# Patient Record
Sex: Female | Born: 1965 | Race: White | Hispanic: No | Marital: Married | Smoking: Former smoker
Health system: Southern US, Community
[De-identification: ages and names within clinical notes are randomized; demographics above are authoritative.]

## PROBLEM LIST (undated history)

## (undated) DIAGNOSIS — E7800 Pure hypercholesterolemia, unspecified: Secondary | ICD-10-CM

## (undated) DIAGNOSIS — C4491 Basal cell carcinoma of skin, unspecified: Secondary | ICD-10-CM

---

## 2005-06-10 ENCOUNTER — Ambulatory Visit: Payer: Self-pay

## 2005-07-15 ENCOUNTER — Ambulatory Visit: Payer: Self-pay | Admitting: Family Medicine

## 2006-03-17 ENCOUNTER — Ambulatory Visit: Payer: Self-pay | Admitting: Surgery

## 2006-03-20 ENCOUNTER — Ambulatory Visit: Payer: Self-pay | Admitting: Surgery

## 2006-10-07 ENCOUNTER — Ambulatory Visit: Payer: Self-pay | Admitting: Surgery

## 2007-12-08 ENCOUNTER — Ambulatory Visit: Payer: Self-pay | Admitting: Family Medicine

## 2015-06-18 ENCOUNTER — Other Ambulatory Visit: Payer: Self-pay | Admitting: Family Medicine

## 2015-06-18 DIAGNOSIS — Z1231 Encounter for screening mammogram for malignant neoplasm of breast: Secondary | ICD-10-CM

## 2015-06-26 ENCOUNTER — Ambulatory Visit
Admission: RE | Admit: 2015-06-26 | Discharge: 2015-06-26 | Disposition: A | Discharge status: Home / Self Care | Payer: BC Managed Care – PPO | Source: Ambulatory Visit | Attending: Family Medicine | Admitting: Family Medicine

## 2015-06-26 DIAGNOSIS — Z1231 Encounter for screening mammogram for malignant neoplasm of breast: Secondary | ICD-10-CM | POA: Diagnosis present

## 2015-07-03 ENCOUNTER — Other Ambulatory Visit: Payer: Self-pay | Admitting: Family Medicine

## 2015-07-03 DIAGNOSIS — R921 Mammographic calcification found on diagnostic imaging of breast: Secondary | ICD-10-CM

## 2015-07-23 ENCOUNTER — Ambulatory Visit
Admission: RE | Admit: 2015-07-23 | Discharge: 2015-07-23 | Disposition: A | Discharge status: Home / Self Care | Payer: BC Managed Care – PPO | Source: Ambulatory Visit | Attending: Family Medicine | Admitting: Family Medicine

## 2015-07-23 ENCOUNTER — Other Ambulatory Visit: Payer: Self-pay | Admitting: Family Medicine

## 2015-07-23 DIAGNOSIS — R921 Mammographic calcification found on diagnostic imaging of breast: Secondary | ICD-10-CM

## 2016-10-25 IMAGING — MG MM DIGITAL DIAGNOSTIC BILAT CAD
5 series · 5 of 5 positions shown · non-contrast
Comparison: Previous exam(s).

CLINICAL DATA: Screening recall for calcifications in the bilateral
breasts.

EXAM:
DIGITAL DIAGNOSTIC BILATERAL MAMMOGRAM WITH 3D TOMOSYNTHESIS AND CAD

[R ML (1 of 2)]
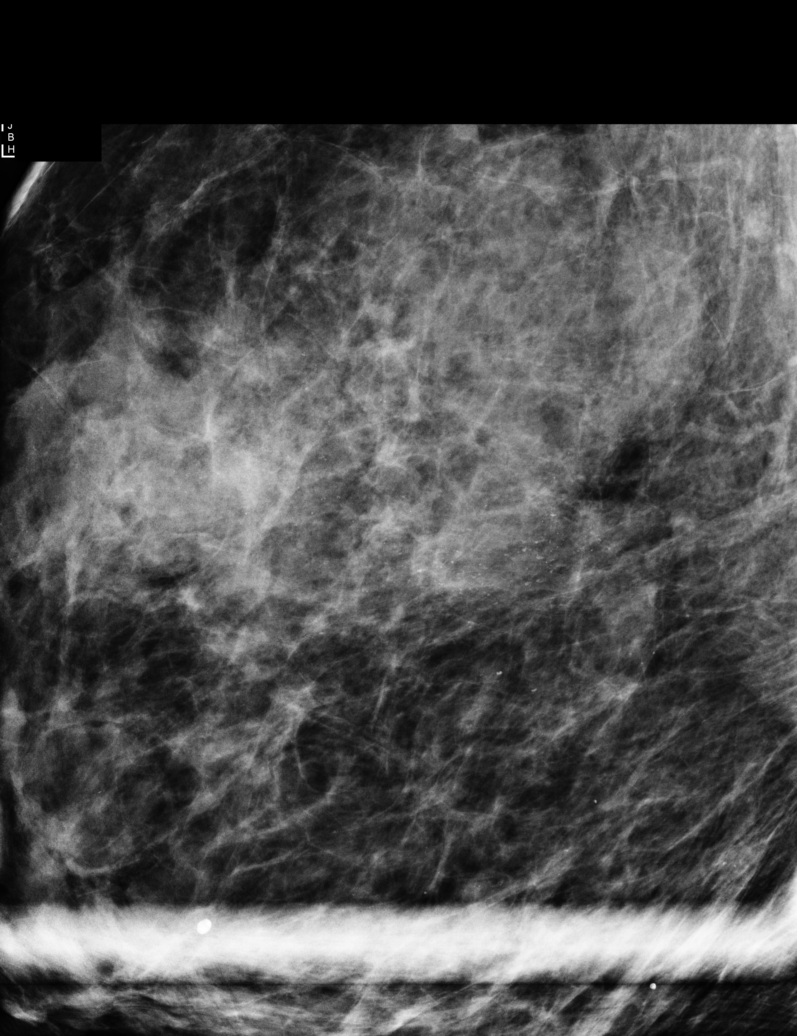

[L CC]
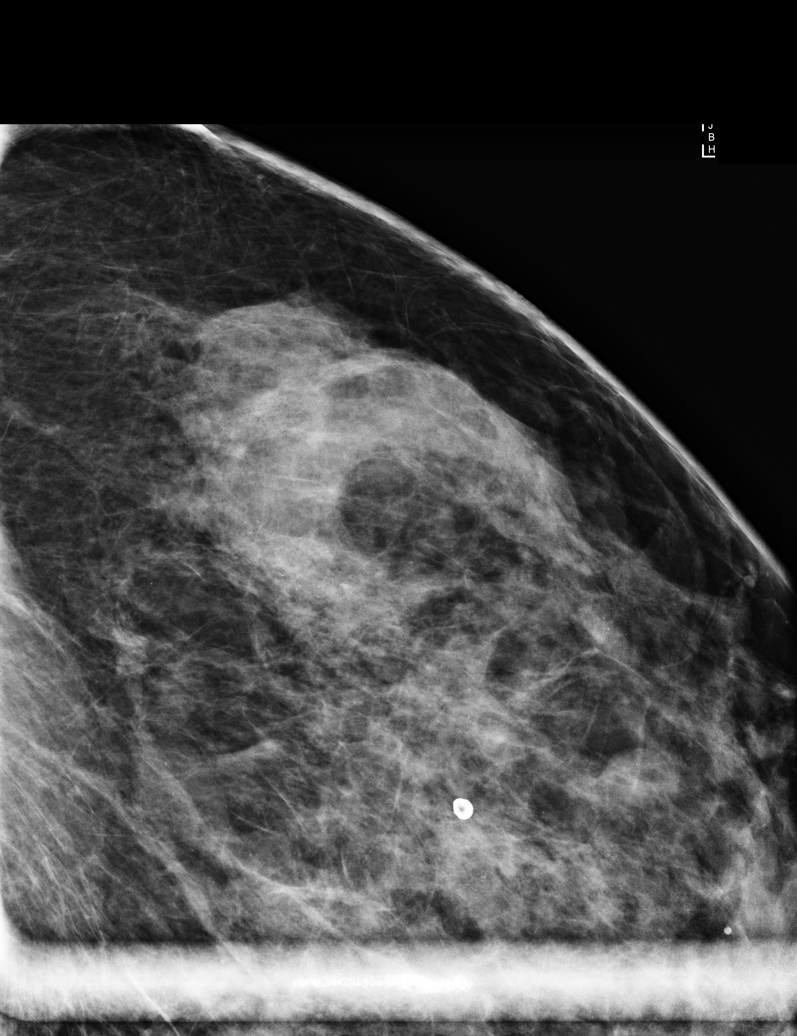

[R ML (2 of 2)]
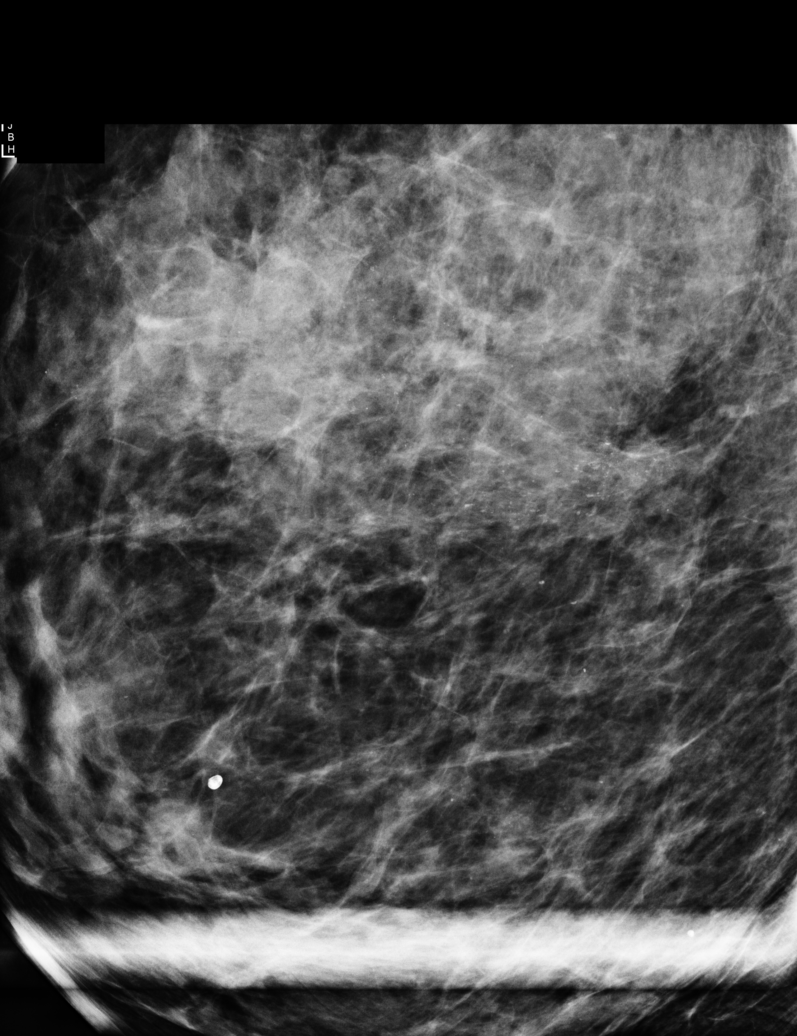

[L ML]
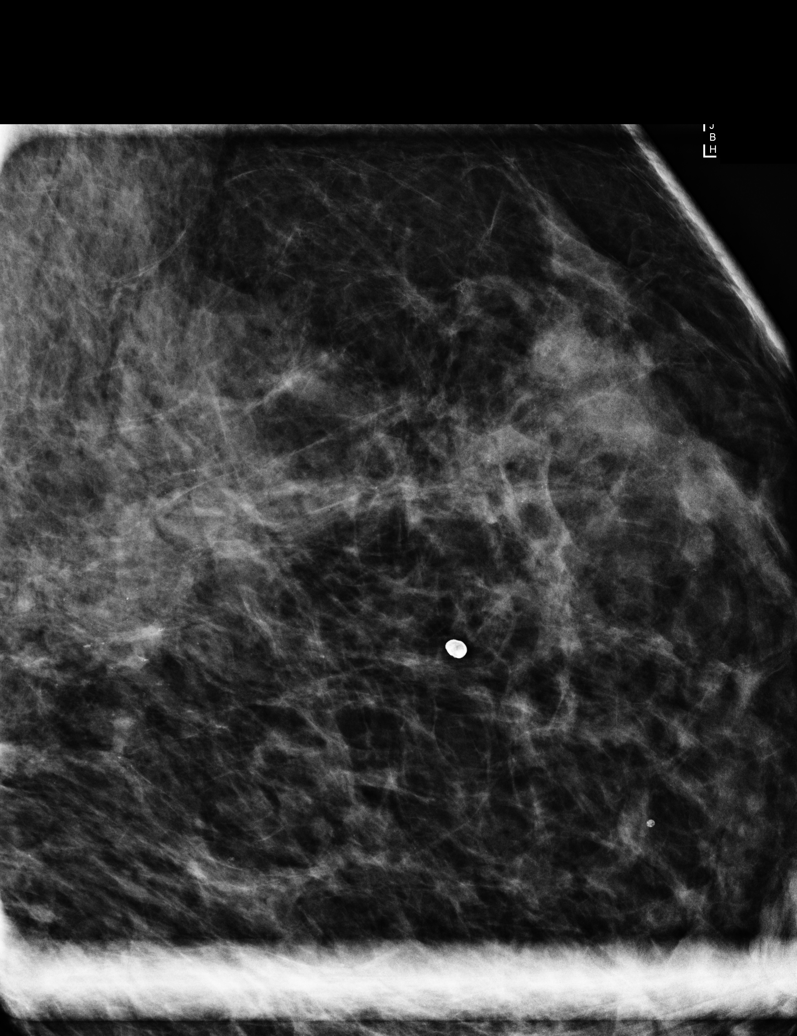

[R CC]
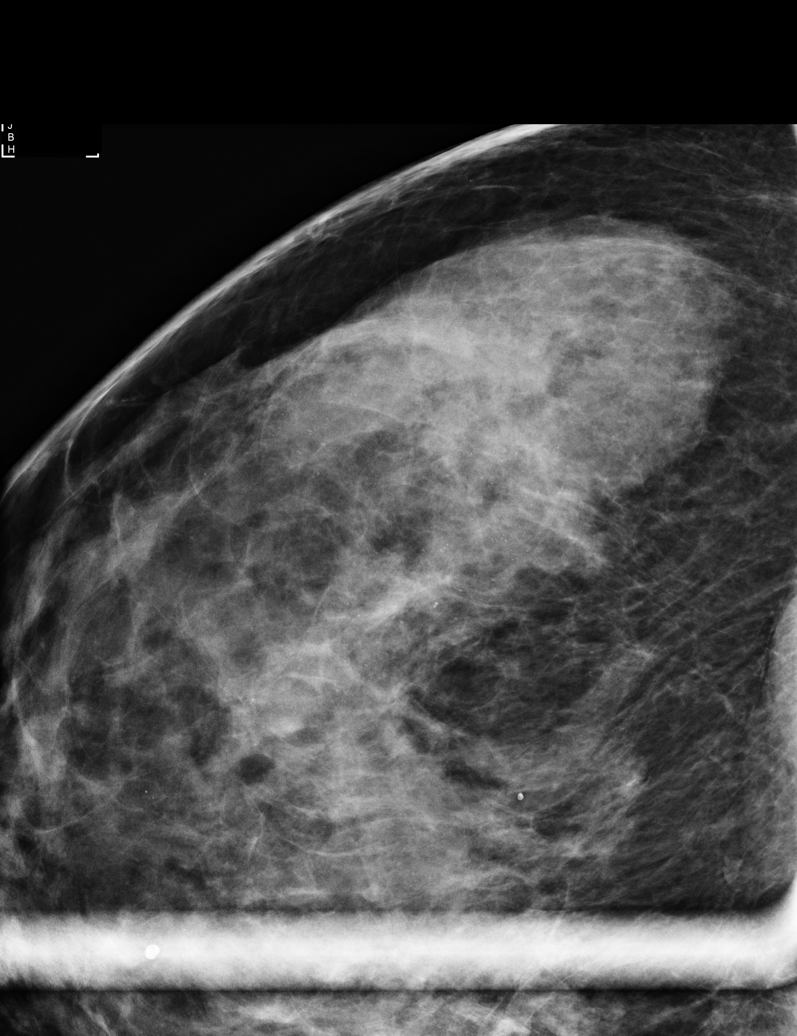

[5 of 5 positions shown; findings below may reference images not displayed]

ACR Breast Density Category c: The breast tissue is heterogeneously
dense, which may obscure small masses.
FINDINGS: The calcifications in the upper-outer quadrants of the bilateral
breasts layer on the true lateral views, compatible with benign milk
of calcium. No other suspicious calcifications, masses or areas of
distortion are seen in the bilateral breasts.

Mammographic images were processed with CAD.
IMPRESSION: Benign milk of calcium is identified in the upper-outer quadrant of
the bilateral breasts. No evidence of malignancy.

RECOMMENDATION:
Screening mammogram in one year.(Code:[id number])

I have discussed the findings and recommendations with the patient.
Results were also provided in writing at the conclusion of the
visit. If applicable, a reminder letter will be sent to the patient
regarding the next appointment.

BI-RADS CATEGORY  2: Benign.

## 2017-04-23 ENCOUNTER — Encounter: Payer: Self-pay | Admitting: *Deleted

## 2017-04-24 ENCOUNTER — Ambulatory Visit: Payer: BC Managed Care – PPO | Admitting: Anesthesiology

## 2017-04-24 ENCOUNTER — Encounter
Admission: RE | Disposition: A | Discharge status: Home / Self Care | Payer: Self-pay | Source: Ambulatory Visit | Attending: Gastroenterology

## 2017-04-24 ENCOUNTER — Encounter: Payer: Self-pay | Admitting: Anesthesiology

## 2017-04-24 ENCOUNTER — Ambulatory Visit
Admission: RE | Admit: 2017-04-24 | Discharge: 2017-04-24 | Disposition: A | Discharge status: Home / Self Care | Payer: BC Managed Care – PPO | Source: Ambulatory Visit | Attending: Gastroenterology | Admitting: Gastroenterology

## 2017-04-24 DIAGNOSIS — K5730 Diverticulosis of large intestine without perforation or abscess without bleeding: Secondary | ICD-10-CM | POA: Diagnosis not present

## 2017-04-24 DIAGNOSIS — K621 Rectal polyp: Secondary | ICD-10-CM | POA: Diagnosis not present

## 2017-04-24 DIAGNOSIS — D120 Benign neoplasm of cecum: Secondary | ICD-10-CM | POA: Diagnosis not present

## 2017-04-24 DIAGNOSIS — Z1211 Encounter for screening for malignant neoplasm of colon: Secondary | ICD-10-CM | POA: Insufficient documentation

## 2017-04-24 DIAGNOSIS — Z87891 Personal history of nicotine dependence: Secondary | ICD-10-CM | POA: Insufficient documentation

## 2017-04-24 DIAGNOSIS — Z85828 Personal history of other malignant neoplasm of skin: Secondary | ICD-10-CM | POA: Diagnosis not present

## 2017-04-24 HISTORY — PX: COLONOSCOPY WITH PROPOFOL: SHX5780

## 2017-04-24 HISTORY — DX: Pure hypercholesterolemia, unspecified: E78.00

## 2017-04-24 HISTORY — DX: Basal cell carcinoma of skin, unspecified: C44.91

## 2017-04-24 LAB — POCT PREGNANCY, URINE: Preg Test, Ur: NEGATIVE

## 2017-04-24 SURGERY — COLONOSCOPY WITH PROPOFOL
Anesthesia: General

## 2017-04-24 MED ORDER — SODIUM CHLORIDE 0.9 % IV SOLN
INTRAVENOUS | Status: DC
  Administered 2017-04-24: 12:00:00 via INTRAVENOUS

## 2017-04-24 MED ORDER — MIDAZOLAM HCL 2 MG/2ML IJ SOLN
INTRAMUSCULAR | Status: AC
  Filled 2017-04-24: qty 2

## 2017-04-24 MED ORDER — PROPOFOL 500 MG/50ML IV EMUL
INTRAVENOUS | Status: AC
  Filled 2017-04-24: qty 50

## 2017-04-24 MED ORDER — FENTANYL CITRATE (PF) 100 MCG/2ML IJ SOLN
INTRAMUSCULAR | Status: DC | PRN
  Administered 2017-04-24: 50 ug via INTRAVENOUS

## 2017-04-24 MED ORDER — EPHEDRINE SULFATE 50 MG/ML IJ SOLN
INTRAMUSCULAR | Status: AC
  Filled 2017-04-24: qty 1

## 2017-04-24 MED ORDER — PROPOFOL 500 MG/50ML IV EMUL
INTRAVENOUS | Status: DC | PRN
  Administered 2017-04-24: 120 ug/kg/min via INTRAVENOUS

## 2017-04-24 MED ORDER — FENTANYL CITRATE (PF) 100 MCG/2ML IJ SOLN
INTRAMUSCULAR | Status: AC
  Filled 2017-04-24: qty 2

## 2017-04-24 MED ORDER — EPHEDRINE SULFATE 50 MG/ML IJ SOLN
INTRAMUSCULAR | Status: DC | PRN
  Administered 2017-04-24: 5 mg via INTRAVENOUS

## 2017-04-24 MED ORDER — MIDAZOLAM HCL 2 MG/2ML IJ SOLN
INTRAMUSCULAR | Status: DC | PRN
  Administered 2017-04-24: 2 mg via INTRAVENOUS

## 2017-04-24 MED ORDER — SODIUM CHLORIDE 0.9 % IV SOLN: INTRAVENOUS | Status: DC

## 2017-04-24 NOTE — H&P (Signed)
Outpatient short stay form Pre-procedure 04/24/2017 12:18 PM Lollie Sails MD  Primary Physician: Dr. Gayland Curry  Reason for visit:  Screening colonoscopy  History of present illness:  Patient is a 51 year old female presenting today as above. She tolerated her prep well. She takes no aspirin or blood thinning agent. This is her first colonoscopy.    Current Facility-Administered Medications:  .  0.9 %  sodium chloride infusion, , Intravenous, Continuous, Lollie Sails, MD .  0.9 %  sodium chloride infusion, , Intravenous, Continuous, Lollie Sails, MD  No prescriptions prior to admission.     No Known Allergies   Past Medical History:  Diagnosis Date  . Basal cell carcinoma    left cheek  . High cholesterol     Review of systems:      Physical Exam    Heart and lungs: Regular rate and rhythm without rub or gallop, lungs are bilaterally clear.    HEENT: Normocephalic atraumatic eyes are anicteric    Other:     Pertinant exam for procedure: Soft nontender nondistended bowel sounds positive normoactive.    Planned proceedures: Colonoscopy and indicated procedures. I have discussed the risks benefits and complications of procedures to include not limited to bleeding, infection, perforation and the risk of sedation and the patient wishes to proceed.    Lollie Sails, MD Gastroenterology 04/24/2017  12:18 PM

## 2017-04-24 NOTE — Anesthesia Procedure Notes (Signed)
Performed by: COOK-MARTIN, Laporche Martelle Pre-anesthesia Checklist: Patient identified, Emergency Drugs available, Suction available, Patient being monitored and Timeout performed Patient Re-evaluated:Patient Re-evaluated prior to induction Oxygen Delivery Method: Nasal cannula Preoxygenation: Pre-oxygenation with 100% oxygen Induction Type: IV induction Placement Confirmation: positive ETCO2 and CO2 detector       

## 2017-04-24 NOTE — Anesthesia Preprocedure Evaluation (Signed)
Anesthesia Evaluation  Patient identified by MRN, date of birth, ID band Patient awake    Reviewed: Allergy & Precautions, NPO status , Patient's Chart, lab work & pertinent test results, reviewed documented beta blocker date and time   Airway Mallampati: II  TM Distance: >3 FB     Dental  (+) Chipped   Pulmonary former smoker,           Cardiovascular      Neuro/Psych    GI/Hepatic   Endo/Other    Renal/GU      Musculoskeletal   Abdominal   Peds  Hematology   Anesthesia Other Findings   Reproductive/Obstetrics                             Anesthesia Physical Anesthesia Plan  ASA: III  Anesthesia Plan: General   Post-op Pain Management:    Induction: Intravenous  PONV Risk Score and Plan:   Airway Management Planned:   Additional Equipment:   Intra-op Plan:   Post-operative Plan:   Informed Consent: I have reviewed the patients History and Physical, chart, labs and discussed the procedure including the risks, benefits and alternatives for the proposed anesthesia with the patient or authorized representative who has indicated his/her understanding and acceptance.     Plan Discussed with: CRNA  Anesthesia Plan Comments:         Anesthesia Quick Evaluation  

## 2017-04-24 NOTE — Transfer of Care (Signed)
Immediate Anesthesia Transfer of Care Note  Patient: Brittney Maxwell  Procedure(s) Performed: Procedure(s): COLONOSCOPY WITH PROPOFOL (N/A)  Patient Location: PACU  Anesthesia Type:General  Level of Consciousness: awake and sedated  Airway & Oxygen Therapy: Patient Spontanous Breathing and Patient connected to nasal cannula oxygen  Post-op Assessment: Report given to RN and Post -op Vital signs reviewed and stable  Post vital signs: Reviewed and stable  Last Vitals:  Vitals:   04/24/17 1210  BP: (!) 158/77  Pulse: 65  Resp: (!) 22  Temp: (!) 36.3 C  SpO2: 100%    Last Pain:  Vitals:   04/24/17 1210  TempSrc: Tympanic         Complications: No apparent anesthesia complications

## 2017-04-24 NOTE — Op Note (Signed)
[facility] Gastroenterology Patient Name: Brittney Maxwell Procedure Date: 04/24/2017 12:29 PM MRN: [id number] Account #: [id number] Date of Birth: 05/30/1966 Admit Type: Outpatient Age: 51 Room: [facility] ENDO ROOM 3 Gender: Female Note Status: Finalized Procedure:            Colonoscopy Indications:          Screening for colorectal malignant neoplasm Providers:            Lollie Sails, MD Referring MD:         Gayland Curry MD, MD (Referring MD) Medicines:            Monitored Anesthesia Care Complications:        No immediate complications. Procedure:            Pre-Anesthesia Assessment:                       - ASA Grade Assessment: III - A patient with severe                        systemic disease.                       After obtaining informed consent, the colonoscope was                        passed under direct vision. Throughout the procedure,                        the patient's blood pressure, pulse, and oxygen                        saturations were monitored continuously. The                        Colonoscope was introduced through the anus and                        advanced to the the cecum, identified by appendiceal                        orifice and ileocecal valve. The colonoscopy was                        performed with moderate difficulty due to poor bowel                        prep. Successful completion of the procedure was aided                        by lavage. Findings:      Multiple small and large-mouthed diverticula were found in the sigmoid       colon and descending colon.      A 2 mm polyp was found in the rectum. The polyp was sessile. The polyp       was removed with a cold biopsy forceps. Resection and retrieval were       complete.      A 3 mm polyp was found in the cecum. The polyp was sessile. The polyp       was removed with a cold biopsy forceps. Resection and retrieval were  complete.      The retroflexed  view of the distal rectum and anal verge was normal and       showed no anal or rectal abnormalities.      The digital rectal exam was normal. Impression:           - Diverticulosis in the sigmoid colon and in the                        descending colon.                       - One 2 mm polyp in the rectum, removed with a cold                        biopsy forceps. Resected and retrieved.                       - One 3 mm polyp in the cecum, removed with a cold                        biopsy forceps. Resected and retrieved.                       - The distal rectum and anal verge are normal on                        retroflexion view. Recommendation:       - Discharge patient to home.                       - Await pathology results.                       - Telephone GI clinic for pathology results in 1 week. Procedure Code(s):    --- Professional ---                       [phone], Colonoscopy, flexible; with biopsy, single or                        multiple Diagnosis Code(s):    --- Professional ---                       Z12.11, Encounter for screening for malignant neoplasm                        of colon                       K62.1, Rectal polyp                       D12.0, Benign neoplasm of cecum                       K57.30, Diverticulosis of large intestine without                        perforation or abscess without bleeding CPT copyright 2016 [employer]. All rights reserved. The codes documented in this report are preliminary and upon coder review may  be revised to meet current compliance requirements.  Lollie Sails, MD 04/24/2017 1:17:53 PM This report has been signed electronically. Number of Addenda: 0 Note Initiated On: 04/24/2017 12:29 PM Scope Withdrawal Time: 0 hours 11 minutes 11 seconds  Total Procedure Duration: 0 hours 26 minutes 53 seconds       [place]

## 2017-04-24 NOTE — Anesthesia Post-op Follow-up Note (Signed)
Anesthesia QCDR form completed.        

## 2017-04-24 NOTE — Anesthesia Postprocedure Evaluation (Signed)
Anesthesia Post Note  Patient: Brittney Maxwell  Procedure(s) Performed: Procedure(s) (LRB): COLONOSCOPY WITH PROPOFOL (N/A)  Patient location during evaluation: Endoscopy Anesthesia Type: General Level of consciousness: awake and alert Pain management: pain level controlled Vital Signs Assessment: post-procedure vital signs reviewed and stable Respiratory status: spontaneous breathing, nonlabored ventilation, respiratory function stable and patient connected to nasal cannula oxygen Cardiovascular status: blood pressure returned to baseline and stable Postop Assessment: no signs of nausea or vomiting Anesthetic complications: no     Last Vitals:  Vitals:   04/24/17 1210 04/24/17 1322  BP: (!) 158/77   Pulse: 65   Resp: (!) 22   Temp: (!) 36.3 C (!) 35.8 C  SpO2: 100%     Last Pain:  Vitals:   04/24/17 1322  TempSrc: Tympanic                 Charlise Giovanetti S

## 2017-04-27 ENCOUNTER — Encounter: Payer: Self-pay | Admitting: Gastroenterology

## 2017-04-27 LAB — SURGICAL PATHOLOGY

## 2019-08-09 ENCOUNTER — Other Ambulatory Visit: Payer: Self-pay

## 2019-08-09 DIAGNOSIS — Z20822 Contact with and (suspected) exposure to covid-19: Secondary | ICD-10-CM

## 2019-08-11 LAB — NOVEL CORONAVIRUS, NAA: SARS-CoV-2, NAA: NOT DETECTED

## 2020-04-09 ENCOUNTER — Other Ambulatory Visit: Payer: Self-pay | Admitting: Family Medicine

## 2020-04-09 DIAGNOSIS — Z1231 Encounter for screening mammogram for malignant neoplasm of breast: Secondary | ICD-10-CM

## 2020-04-11 ENCOUNTER — Ambulatory Visit
Admission: RE | Admit: 2020-04-11 | Discharge: 2020-04-11 | Disposition: A | Discharge status: Home / Self Care | Payer: BC Managed Care – PPO | Source: Ambulatory Visit | Attending: Family Medicine | Admitting: Family Medicine

## 2020-04-11 ENCOUNTER — Other Ambulatory Visit: Payer: Self-pay

## 2020-04-11 DIAGNOSIS — Z1231 Encounter for screening mammogram for malignant neoplasm of breast: Secondary | ICD-10-CM | POA: Insufficient documentation

## 2021-05-22 ENCOUNTER — Other Ambulatory Visit: Payer: Self-pay | Admitting: Family Medicine

## 2021-05-22 DIAGNOSIS — N852 Hypertrophy of uterus: Secondary | ICD-10-CM

## 2021-06-06 ENCOUNTER — Other Ambulatory Visit: Payer: Self-pay

## 2021-06-06 ENCOUNTER — Ambulatory Visit
Admission: RE | Admit: 2021-06-06 | Discharge: 2021-06-06 | Disposition: A | Discharge status: Home / Self Care | Payer: BC Managed Care – PPO | Source: Ambulatory Visit | Attending: Family Medicine | Admitting: Family Medicine

## 2021-06-06 DIAGNOSIS — N852 Hypertrophy of uterus: Secondary | ICD-10-CM | POA: Diagnosis not present

## 2022-06-11 ENCOUNTER — Other Ambulatory Visit: Payer: Self-pay | Admitting: Family Medicine

## 2022-06-11 DIAGNOSIS — Z1231 Encounter for screening mammogram for malignant neoplasm of breast: Secondary | ICD-10-CM

## 2022-07-09 ENCOUNTER — Ambulatory Visit
Admission: RE | Admit: 2022-07-09 | Discharge: 2022-07-09 | Disposition: A | Discharge status: Home / Self Care | Payer: BC Managed Care – PPO | Source: Ambulatory Visit | Attending: Family Medicine | Admitting: Family Medicine

## 2022-07-09 DIAGNOSIS — Z1231 Encounter for screening mammogram for malignant neoplasm of breast: Secondary | ICD-10-CM | POA: Insufficient documentation

## 2022-07-11 ENCOUNTER — Other Ambulatory Visit: Payer: Self-pay | Admitting: Physician Assistant

## 2022-07-11 DIAGNOSIS — N63 Unspecified lump in unspecified breast: Secondary | ICD-10-CM

## 2022-07-11 DIAGNOSIS — R928 Other abnormal and inconclusive findings on diagnostic imaging of breast: Secondary | ICD-10-CM

## 2022-07-22 ENCOUNTER — Other Ambulatory Visit: Payer: BC Managed Care – PPO

## 2022-07-22 ENCOUNTER — Ambulatory Visit
Admission: RE | Admit: 2022-07-22 | Discharge: 2022-07-22 | Disposition: A | Discharge status: Home / Self Care | Payer: BC Managed Care – PPO | Source: Ambulatory Visit | Attending: Physician Assistant | Admitting: Physician Assistant

## 2022-07-22 DIAGNOSIS — R928 Other abnormal and inconclusive findings on diagnostic imaging of breast: Secondary | ICD-10-CM

## 2022-07-22 DIAGNOSIS — N63 Unspecified lump in unspecified breast: Secondary | ICD-10-CM | POA: Diagnosis present

## 2023-07-20 ENCOUNTER — Other Ambulatory Visit: Payer: Self-pay | Admitting: Family Medicine

## 2023-07-20 DIAGNOSIS — Z1231 Encounter for screening mammogram for malignant neoplasm of breast: Secondary | ICD-10-CM

## 2024-01-20 ENCOUNTER — Ambulatory Visit
Admission: RE | Admit: 2024-01-20 | Discharge: 2024-01-20 | Disposition: A | Discharge status: Home / Self Care | Payer: Self-pay | Source: Ambulatory Visit | Attending: Family Medicine | Admitting: Family Medicine

## 2024-01-20 DIAGNOSIS — Z1231 Encounter for screening mammogram for malignant neoplasm of breast: Secondary | ICD-10-CM | POA: Diagnosis present
